# Patient Record
Sex: Male | Born: 1955 | Race: White | Hispanic: No | Marital: Single | Smoking: Never smoker
Health system: Southern US, Community
[De-identification: ages and names within clinical notes are randomized; demographics above are authoritative.]

## PROBLEM LIST (undated history)

## (undated) DIAGNOSIS — J449 Chronic obstructive pulmonary disease, unspecified: Secondary | ICD-10-CM

## (undated) DIAGNOSIS — J302 Other seasonal allergic rhinitis: Secondary | ICD-10-CM

## (undated) HISTORY — PX: EYE SURGERY: SHX253

## (undated) HISTORY — PX: TONSILLECTOMY: SUR1361

---

## 2008-10-26 ENCOUNTER — Ambulatory Visit: Payer: Self-pay | Admitting: Internal Medicine

## 2009-07-17 ENCOUNTER — Ambulatory Visit: Payer: Self-pay | Admitting: Family Medicine

## 2009-09-27 ENCOUNTER — Ambulatory Visit: Payer: Self-pay | Admitting: Internal Medicine

## 2009-10-28 ENCOUNTER — Ambulatory Visit: Payer: Self-pay | Admitting: Orthopedic Surgery

## 2010-09-15 ENCOUNTER — Emergency Department: Payer: Self-pay | Admitting: Emergency Medicine

## 2011-07-11 HISTORY — PX: RETINAL DETACHMENT SURGERY: SHX105

## 2011-10-07 ENCOUNTER — Ambulatory Visit: Payer: Self-pay | Admitting: Family Medicine

## 2013-03-23 ENCOUNTER — Ambulatory Visit: Payer: Self-pay | Admitting: Emergency Medicine

## 2014-10-02 ENCOUNTER — Ambulatory Visit: Payer: Self-pay | Admitting: Family Medicine

## 2015-01-30 ENCOUNTER — Ambulatory Visit
Admission: EM | Admit: 2015-01-30 | Discharge: 2015-01-30 | Disposition: A | Discharge status: Home / Self Care | Payer: BC Managed Care – PPO | Attending: Emergency Medicine | Admitting: Emergency Medicine

## 2015-01-30 DIAGNOSIS — Z23 Encounter for immunization: Secondary | ICD-10-CM | POA: Diagnosis not present

## 2015-01-30 DIAGNOSIS — T148 Other injury of unspecified body region: Secondary | ICD-10-CM | POA: Diagnosis not present

## 2015-01-30 DIAGNOSIS — T148XXA Other injury of unspecified body region, initial encounter: Secondary | ICD-10-CM

## 2015-01-30 HISTORY — DX: Chronic obstructive pulmonary disease, unspecified: J44.9

## 2015-01-30 MED ORDER — MUPIROCIN 2 % EX OINT: 1.0000 "application " | TOPICAL_OINTMENT | Freq: Three times a day (TID) | CUTANEOUS | Status: AC

## 2015-01-30 MED ORDER — TETANUS-DIPHTH-ACELL PERTUSSIS 5-2.5-18.5 LF-MCG/0.5 IM SUSP
0.5000 mL | Freq: Once | INTRAMUSCULAR | Status: AC
  Administered 2015-01-30: 0.5 mL via INTRAMUSCULAR

## 2015-01-30 NOTE — ED Provider Notes (Signed)
CSN: [id number]     Arrival date & time 01/30/15  1352 History   None    Chief Complaint  Patient presents with  . Laceration    Superficial laceration to right forearm and unsure when last tetanus. Would like tetanus to be updated today. No active bleeding. Reports he was mowing the grass and a wire cut his arm.    (Consider location/radiation/quality/duration/timing/severity/associated sxs/prior Treatment) HPI  59 year old male presents today with a very small superficial laceration to his right extensor forearm when a piece of wire cut his arm while he was mowing the grass. He states he cannot remember when he had his last tetanus shot and came in today mostly for that reason.  Past Medical History  Diagnosis Date  . COPD (chronic obstructive pulmonary disease)    Past Surgical History  Procedure Laterality Date  . Tonsillectomy    . Retinal detachment surgery     No family history on file. History  Substance Use Topics  . Smoking status: Unknown If Ever Smoked  . Smokeless tobacco: Not on file  . Alcohol Use: No    Review of Systems  Skin: Positive for wound.  All other systems reviewed and are negative.   Allergies  Review of patient's allergies indicates no known allergies.  Home Medications   Prior to Admission medications   Medication Sig Start Date End Date Taking? Authorizing Provider  fluticasone (FLONASE) 50 MCG/ACT nasal spray Place into both nostrils daily.   Yes Historical Provider, MD  Fluticasone-Salmeterol (ADVAIR) 250-50 MCG/DOSE AEPB Inhale 1 puff into the lungs 2 (two) times daily.   Yes Historical Provider, MD  mupirocin ointment (BACTROBAN) 2 % Apply 1 application topically 3 (three) times daily. 01/30/15   Chrissie Noa Brynnley Dayrit, PA-C   BP 132/80 mmHg  Pulse 79  Temp(Src) 98.1 F (36.7 C) (Oral)  Resp 18  Ht  (1.905 m)  Wt 205 lb (92.987 kg)  BMI 25.62 kg/m2  SpO2 98% Physical Exam  Constitutional: He is oriented to person, place, and time.  He appears well-developed and well-nourished.  HENT:  Head: Normocephalic and atraumatic.  Eyes: Pupils are equal, round, and reactive to light.  Neurological: He is alert and oriented to person, place, and time.  Skin:  Admission of the right forearm shows a small laceration through the epidermis with intact dermis beneath. Measures half centimeter in length. It has sharp edges without flapping.  Psychiatric: He has a normal mood and affect. His behavior is normal. Judgment and thought content normal.  Nursing note and vitals reviewed.   ED Course  Procedures (including critical care time) Labs Review Labs Reviewed - No data to display  Imaging Review No results found. Medications  Tdap (BOOSTRIX) injection 0.5 mL (0.5 mLs Intramuscular Given 01/30/15 1420)    MDM   1. Superficial laceration    New Prescriptions   MUPIROCIN OINTMENT (BACTROBAN) 2 %    Apply 1 application topically 3 (three) times daily.  Plan: 1. Diagnosis reviewed with patient 2. rx as per orders; risks, benefits, potential side effects reviewed with patient 3. Recommend supportive treatment with cleaning,Bactroban 4. F/u prn if symptoms worsen or don't improve     Lutricia Feil, PA-C 01/30/15 1452

## 2015-03-17 ENCOUNTER — Ambulatory Visit: Payer: BC Managed Care – PPO

## 2015-03-17 ENCOUNTER — Ambulatory Visit
Admission: EM | Admit: 2015-03-17 | Discharge: 2015-03-17 | Disposition: A | Discharge status: Home / Self Care | Payer: BC Managed Care – PPO | Attending: Family Medicine | Admitting: Family Medicine

## 2015-03-17 DIAGNOSIS — S60511A Abrasion of right hand, initial encounter: Secondary | ICD-10-CM | POA: Diagnosis not present

## 2015-03-17 NOTE — ED Notes (Signed)
Patient states that right hand was hit with softball bat at practice tonight. He states area was bleeding and he did put ice on it. Patient states that area is throbbing.

## 2015-03-20 NOTE — ED Provider Notes (Signed)
CSN: [id number]     Arrival date & time 03/17/15  1911 History   First MD Initiated Contact with Patient 03/17/15 2010     Chief Complaint  Patient presents with  . Hand Pain   (Consider location/radiation/quality/duration/timing/severity/associated sxs/prior Treatment) HPI  59 yo M  presents with painful right hand. 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 Dorsum slightly abraded, mild swelling and tender, ecchymosis.  Was seen last month for superficial wound received doing yard work and his tetanus is updated. No functional deficit. Dorsum tender to touch.  Past Medical History  Diagnosis Date  . COPD (chronic obstructive pulmonary disease)    Past Surgical History  Procedure Laterality Date  . Tonsillectomy    . Retinal detachment surgery Right 2013   Family History  Problem Relation Age of Onset  . Congestive Heart Failure Mother   . Lung cancer Father    Social History  Substance Use Topics  . Smoking status: Never Smoker   . Smokeless tobacco: None  . Alcohol Use: No    Review of Systems Review of 10 systems negative for acute change except as referenced in HPI  Allergies  Review of patient's allergies indicates no known allergies.  Home Medications   Prior to Admission medications   Medication Sig Start Date End Date Taking? Authorizing Provider  fluticasone (FLONASE) 50 MCG/ACT nasal spray Place into both nostrils daily.   Yes Historical Provider, MD  Fluticasone-Salmeterol (ADVAIR) 250-50 MCG/DOSE AEPB Inhale 1 puff into the lungs 2 (two) times daily.   Yes Historical Provider, MD  mupirocin ointment (BACTROBAN) 2 % Apply 1 application topically 3 (three) times daily. 01/30/15   Lutricia Feil, PA-C   Meds Ordered and Administered this Visit  Medications - No data to display  BP 151/75 mmHg  Pulse 77  Temp(Src) 97.2 F (36.2 C) (Oral)  Resp 16  Ht 6\' 3"  (1.905 m)  Wt 205 lb (92.987 kg)  BMI 25.62 kg/m2  SpO2 94% No data  found. Marland Kitchenlwl  Physical Exam   Constitutional: Alert and oriented, well appearing, VS are noted,  General : no acute distress; HEENT:  Head:normocephalic, atraumatic,                Eyes: conjugate gaze,negative conjunctiva      Mouth/throat :Mucous membranes moist, Neck :  supple without thyromegaly Lymph: without enlargement Lung:   effort and breath sounds normal , no distress Heart:   normal rate,regular rhythm,  Back:    No CVAT, no spinal tenderness noted MSK:   nontender, normal ROM all extremities;normal flexion; ambulatory, on and off table without assistance Neuro: CN ll-Xl as tested,grossly intact; normal gait, normal speech and language Skin:  Warm,dry, dorsum right hand to 2 very small abrasions, no active bleeding, dorsum with ecchymosis ,mild tenderness- full function fingers, hand ,wrist  Good cap fill, no neurosensory changes Psych: mood and affect WNL  ED Course  Procedures (including critical care time)  Wounds cleansed with surgical scrub , rinsed, dried and triple antibiotic with single bandaid placed to cover both areas Labs Review Labs Reviewed - No data to display  Imaging Review No results found.  :     Xrays: negative for acute fracture or dislocation     MDM   1. Abrasion, hand, right, initial encounter    Dressing and wound care reviewed with patient Has Bactroban from previous visit Report back with inflammation or signs of infection, reviewed with patient  Diagnosis  and treatment discussed. . Questions fielded, expectations and recommendations reviewed.  Patient expresses understanding. Will return to [facility] with questions, concern or exacerbation.     Rae Halsted, PA-C 03/20/15 [phone]

## 2015-04-25 ENCOUNTER — Ambulatory Visit
Admission: EM | Admit: 2015-04-25 | Discharge: 2015-04-25 | Disposition: A | Discharge status: Home / Self Care | Payer: BC Managed Care – PPO | Attending: Family Medicine | Admitting: Family Medicine

## 2015-04-25 ENCOUNTER — Encounter: Payer: Self-pay | Admitting: Gynecology

## 2015-04-25 DIAGNOSIS — J440 Chronic obstructive pulmonary disease with acute lower respiratory infection: Secondary | ICD-10-CM

## 2015-04-25 DIAGNOSIS — J209 Acute bronchitis, unspecified: Secondary | ICD-10-CM

## 2015-04-25 LAB — RAPID STREP SCREEN (MED CTR MEBANE ONLY): Streptococcus, Group A Screen (Direct): NEGATIVE

## 2015-04-25 MED ORDER — SULFAMETHOXAZOLE-TRIMETHOPRIM 800-160 MG PO TABS: 1.0000 | ORAL_TABLET | Freq: Two times a day (BID) | ORAL | Status: AC

## 2015-04-25 MED ORDER — HYDROCOD POLST-CPM POLST ER 10-8 MG/5ML PO SUER: 5.0000 mL | Freq: Two times a day (BID) | ORAL | Status: DC

## 2015-04-25 NOTE — ED Notes (Signed)
Patient c/o cough x 2 days and a sore throat.

## 2015-04-25 NOTE — ED Provider Notes (Signed)
CSN: [id number]     Arrival date & time 04/25/15  1527 History   First MD Initiated Contact with Patient 04/25/15 1600     Chief Complaint  Patient presents with  . Cough   (Consider location/radiation/quality/duration/timing/severity/associated sxs/prior Treatment) HPI   This a 59 year old with a history of COPD resents with coughing for the last 2 days. The cough is productive with greenish sputum. He also has a low-grade temperature of 99.3 and O2 sat of 100%. He states that whenever he lies down or after he eats the coughing is much worse. Is unable to sleep because of the cough. He never smoked but was around secondhand smoke with his  father a heavy smoker. 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  Past Medical History  Diagnosis Date  . COPD (chronic obstructive pulmonary disease) [facility](HCC)    Past Surgical History  Procedure Laterality Date  . Tonsillectomy    . Retinal detachment surgery Right 2013   Family History  Problem Relation Age of Onset  . Congestive Heart Failure Mother   . Lung cancer Father    Social History  Substance Use Topics  . Smoking status: Never Smoker   . Smokeless tobacco: None  . Alcohol Use: No    Review of Systems  Constitutional: Positive for fever and chills. Negative for diaphoresis and fatigue.  HENT: Positive for congestion and sore throat.   Respiratory: Positive for choking. Negative for shortness of breath, wheezing and stridor.   All other systems reviewed and are negative.   Allergies  Review of patient's allergies indicates no known allergies.  Home Medications   Prior to Admission medications   Medication Sig Start Date End Date Taking? Authorizing Provider  fluticasone (FLONASE) 50 MCG/ACT nasal spray Place into both nostrils daily.   Yes Historical Provider, MD  Fluticasone-Salmeterol (ADVAIR) 250-50 MCG/DOSE AEPB Inhale 1 puff into the  lungs 2 (two) times daily.   Yes Historical Provider, MD  mupirocin ointment (BACTROBAN) 2 % Apply 1 application topically 3 (three) times daily. 01/30/15  Yes Lutricia FeilWilliam P Roemer, PA-C  chlorpheniramine-HYDROcodone (TUSSIONEX PENNKINETIC ER) 10-8 MG/5ML SUER Take 5 mLs by mouth 2 (two) times daily. 04/25/15   Lutricia FeilWilliam P Roemer, PA-C  sulfamethoxazole-trimethoprim (BACTRIM DS,SEPTRA DS) 800-160 MG tablet Take 1 tablet by mouth 2 (two) times daily. 04/25/15   Lutricia FeilWilliam P Roemer, PA-C   Meds Ordered and Administered this Visit  Medications - No data to display  BP 128/86 mmHg  Pulse 74  Temp(Src) 99.3 F (37.4 C) (Oral)  Resp 16  Ht 6\' 3"  (1.905 m)  Wt 203 lb (92.08 kg)  BMI 25.37 kg/m2  SpO2 100% No data found.   Physical Exam  Constitutional: He is oriented to person, place, and time. He appears well-developed and well-nourished. No distress.  HENT:  Head: Normocephalic and atraumatic.  Right Ear: External ear normal.  Left Ear: External ear normal.  Nose: Nose normal.  Mouth/Throat: Oropharynx is clear and moist.  Eyes: Pupils are equal, round, and reactive to light.  Neck: Neck supple.  Pulmonary/Chest: Effort normal and breath sounds normal. No respiratory distress. He has no wheezes. He has no rales.  Musculoskeletal: Normal range of motion. He exhibits no edema or tenderness.  Lymphadenopathy:    He has no cervical adenopathy.  Neurological: He is alert and oriented to person, place, and time.  Skin: Skin is warm and dry. He is not  diaphoretic.  Psychiatric: He has a normal mood and affect. His behavior is normal. Judgment and thought content normal.  Nursing note and vitals reviewed.   ED Course  Procedures (including critical care time)  Labs Review Labs Reviewed  RAPID STREP SCREEN (NOT AT [facility])  CULTURE, GROUP A STREP ([facility] ONLY)    Imaging Review No results found.   Visual Acuity Review  Right Eye Distance:   Left Eye Distance:   Bilateral Distance:     Right Eye Near:   Left Eye Near:    Bilateral Near:         MDM   1. COPD (chronic obstructive pulmonary disease) with acute bronchitis (HCC)    Discharge Medication List as of 04/25/2015  4:20 PM    START taking these medications   Details  chlorpheniramine-HYDROcodone (TUSSIONEX PENNKINETIC ER) 10-8 MG/5ML SUER Take 5 mLs by mouth 2 (two) times daily., Starting 04/25/2015, Until Discontinued, Print    sulfamethoxazole-trimethoprim (BACTRIM DS,SEPTRA DS) 800-160 MG tablet Take 1 tablet by mouth 2 (two) times daily., Starting 04/25/2015, Until Discontinued, Print      Plan: 1. Test/x-ray results and diagnosis reviewed with patient 2. rx as per orders; risks, benefits, potential side effects reviewed with patient 3. Recommend supportive treatment with rest,fluids,albuterol PRN 4. F/u prn if symptoms worsen or don't improve     Lutricia Feil, PA-C 04/25/15 1659

## 2015-04-27 LAB — CULTURE, GROUP A STREP (THRC)

## 2015-04-27 NOTE — ED Notes (Signed)
Final report of strep screening test negative

## 2016-11-08 ENCOUNTER — Ambulatory Visit
Admission: EM | Admit: 2016-11-08 | Discharge: 2016-11-08 | Disposition: A | Discharge status: Home / Self Care | Payer: BC Managed Care – PPO | Attending: Family Medicine | Admitting: Family Medicine

## 2016-11-08 ENCOUNTER — Encounter: Payer: Self-pay | Admitting: *Deleted

## 2016-11-08 DIAGNOSIS — J069 Acute upper respiratory infection, unspecified: Secondary | ICD-10-CM

## 2016-11-08 DIAGNOSIS — B9789 Other viral agents as the cause of diseases classified elsewhere: Secondary | ICD-10-CM

## 2016-11-08 MED ORDER — HYDROCOD POLST-CPM POLST ER 10-8 MG/5ML PO SUER: 5.0000 mL | Freq: Two times a day (BID) | ORAL | 0 refills | Status: AC | PRN

## 2016-11-08 NOTE — ED Provider Notes (Signed)
[facility]    CSN: [id number] Arrival date & time: 11/08/16  1533     History   Chief Complaint Chief Complaint  Patient presents with  . Fever  . Cough  . Nasal Congestion    HPI George Robertson. is a 61 y.o. male.   The history is provided by the patient.  URI  Presenting symptoms: congestion, cough, fever and rhinorrhea   Severity:  Moderate Onset quality:  Sudden Duration:  3 days Timing:  Constant Progression:  Worsening Chronicity:  New Relieved by:  Nothing Ineffective treatments:  OTC medications Associated symptoms: no arthralgias, no headaches, no myalgias, no neck pain, no sinus pain, no sneezing and no wheezing   Risk factors: sick contacts (school)   Risk factors: not elderly, no immunosuppression and no recent travel     Past Medical History:  Diagnosis Date  . COPD (chronic obstructive pulmonary disease) (HCC)     There are no active problems to display for this patient.   Past Surgical History:  Procedure Laterality Date  . RETINAL DETACHMENT SURGERY Right 2013  . TONSILLECTOMY         Home Medications    Prior to Admission medications   Medication Sig Start Date End Date Taking? Authorizing Provider  fluticasone (FLONASE) 50 MCG/ACT nasal spray Place into both nostrils daily.   Yes Historical Provider, MD  Fluticasone-Salmeterol (ADVAIR) 250-50 MCG/DOSE AEPB Inhale 1 puff into the lungs 2 (two) times daily.   Yes Historical Provider, MD  chlorpheniramine-HYDROcodone (TUSSIONEX PENNKINETIC ER) 10-8 MG/5ML SUER Take 5 mLs by mouth every 12 (twelve) hours as needed. 11/08/16   Payton Mccallum, MD  mupirocin ointment (BACTROBAN) 2 % Apply 1 application topically 3 (three) times daily. 01/30/15   Lutricia Feil, PA-C  sulfamethoxazole-trimethoprim (BACTRIM DS,SEPTRA DS) 800-160 MG tablet Take 1 tablet by mouth 2 (two) times daily. 04/25/15   Lutricia Feil, PA-C    Family History Family History  Problem Relation Age of  Onset  . Congestive Heart Failure Mother   . Lung cancer Father     Social History Social History  Substance Use Topics  . Smoking status: Never Smoker  . Smokeless tobacco: Never Used  . Alcohol use No     Allergies   Patient has no known allergies.   Review of Systems Review of Systems  Constitutional: Positive for fever.  HENT: Positive for congestion and rhinorrhea. Negative for sinus pain and sneezing.   Respiratory: Positive for cough. Negative for wheezing.   Musculoskeletal: Negative for arthralgias, myalgias and neck pain.  Neurological: Negative for headaches.     Physical Exam Triage Vital Signs ED Triage Vitals  Enc Vitals Group     BP 11/08/16 1632 134/89     Pulse Rate 11/08/16 1632 70     Resp 11/08/16 1632 16     Temp 11/08/16 1632 98.9 F (37.2 C)     Temp Source 11/08/16 1632 Oral     SpO2 11/08/16 1632 98 %     Weight 11/08/16 1633 211 lb (95.7 kg)     Height 11/08/16 1633  (1.905 m)     Head Circumference --      Peak Flow --      Pain Score 11/08/16 1634 0     Pain Loc --      Pain Edu? --      Excl. in GC? --    No data found.   Updated Vital Signs BP  134/89 (BP Location: Left Arm)   Pulse 70   Temp 98.9 F (37.2 C) (Oral)   Resp 16   Ht  (1.905 m)   Wt 211 lb (95.7 kg)   SpO2 98%   BMI 26.37 kg/m   Visual Acuity Right Eye Distance:   Left Eye Distance:   Bilateral Distance:    Right Eye Near:   Left Eye Near:    Bilateral Near:     Physical Exam  Constitutional: He appears well-developed and well-nourished. No distress.  HENT:  Head: Normocephalic and atraumatic.  Right Ear: Tympanic membrane, external ear and ear canal normal.  Left Ear: Tympanic membrane, external ear and ear canal normal.  Nose: Nose normal.  Mouth/Throat: Uvula is midline, oropharynx is clear and moist and mucous membranes are normal. No oropharyngeal exudate or tonsillar abscesses.  Eyes: Conjunctivae and EOM are normal. Pupils are  equal, round, and reactive to light. Right eye exhibits no discharge. Left eye exhibits no discharge. No scleral icterus.  Neck: Normal range of motion. Neck supple. No tracheal deviation present. No thyromegaly present.  Cardiovascular: Normal rate, regular rhythm and normal heart sounds.   Pulmonary/Chest: Effort normal and breath sounds normal. No stridor. No respiratory distress. He has no wheezes. He has no rales. He exhibits no tenderness.  Lymphadenopathy:    He has no cervical adenopathy.  Neurological: He is alert.  Skin: Skin is warm and dry. No rash noted. He is not diaphoretic.  Nursing note and vitals reviewed.    UC Treatments / Results  Labs (all labs ordered are listed, but only abnormal results are displayed) Labs Reviewed - No data to display  EKG  EKG Interpretation None       Radiology No results found.  Procedures Procedures (including critical care time)  Medications Ordered in UC Medications - No data to display   Initial Impression / Assessment and Plan / UC Course  I have reviewed the triage vital signs and the nursing notes.  Pertinent labs & imaging results that were available during my care of the patient were reviewed by me and considered in my medical decision making (see chart for details).       Final Clinical Impressions(s) / UC Diagnoses   Final diagnoses:  Viral URI with cough    New Prescriptions Discharge Medication List as of 11/08/2016  5:34 PM     1. diagnosis reviewed with patient 2. rx as per orders above; reviewed possible side effects, interactions, risks and benefits  3. Recommend supportive treatment with rest, fuids  4. Follow-up prn if symptoms worsen or don't improve   Payton Mccallum, MD 11/08/16 1810

## 2016-11-08 NOTE — ED Triage Notes (Signed)
Patient started having symptoms of cough, nasal congestion, fever 3 days ago.

## 2016-11-08 NOTE — Discharge Instructions (Signed)
Tylenol/advil as needed Sudafed decongest as needed

## 2017-05-30 IMAGING — CR DG HAND COMPLETE 3+V*R*
3 series · 3 of 3 positions shown · non-contrast
Comparison: None.

CLINICAL DATA: Trauma to the dorsal aspect of the right hand with a
softball

EXAM:
RIGHT HAND - COMPLETE 3+ VIEW

[hand ap]
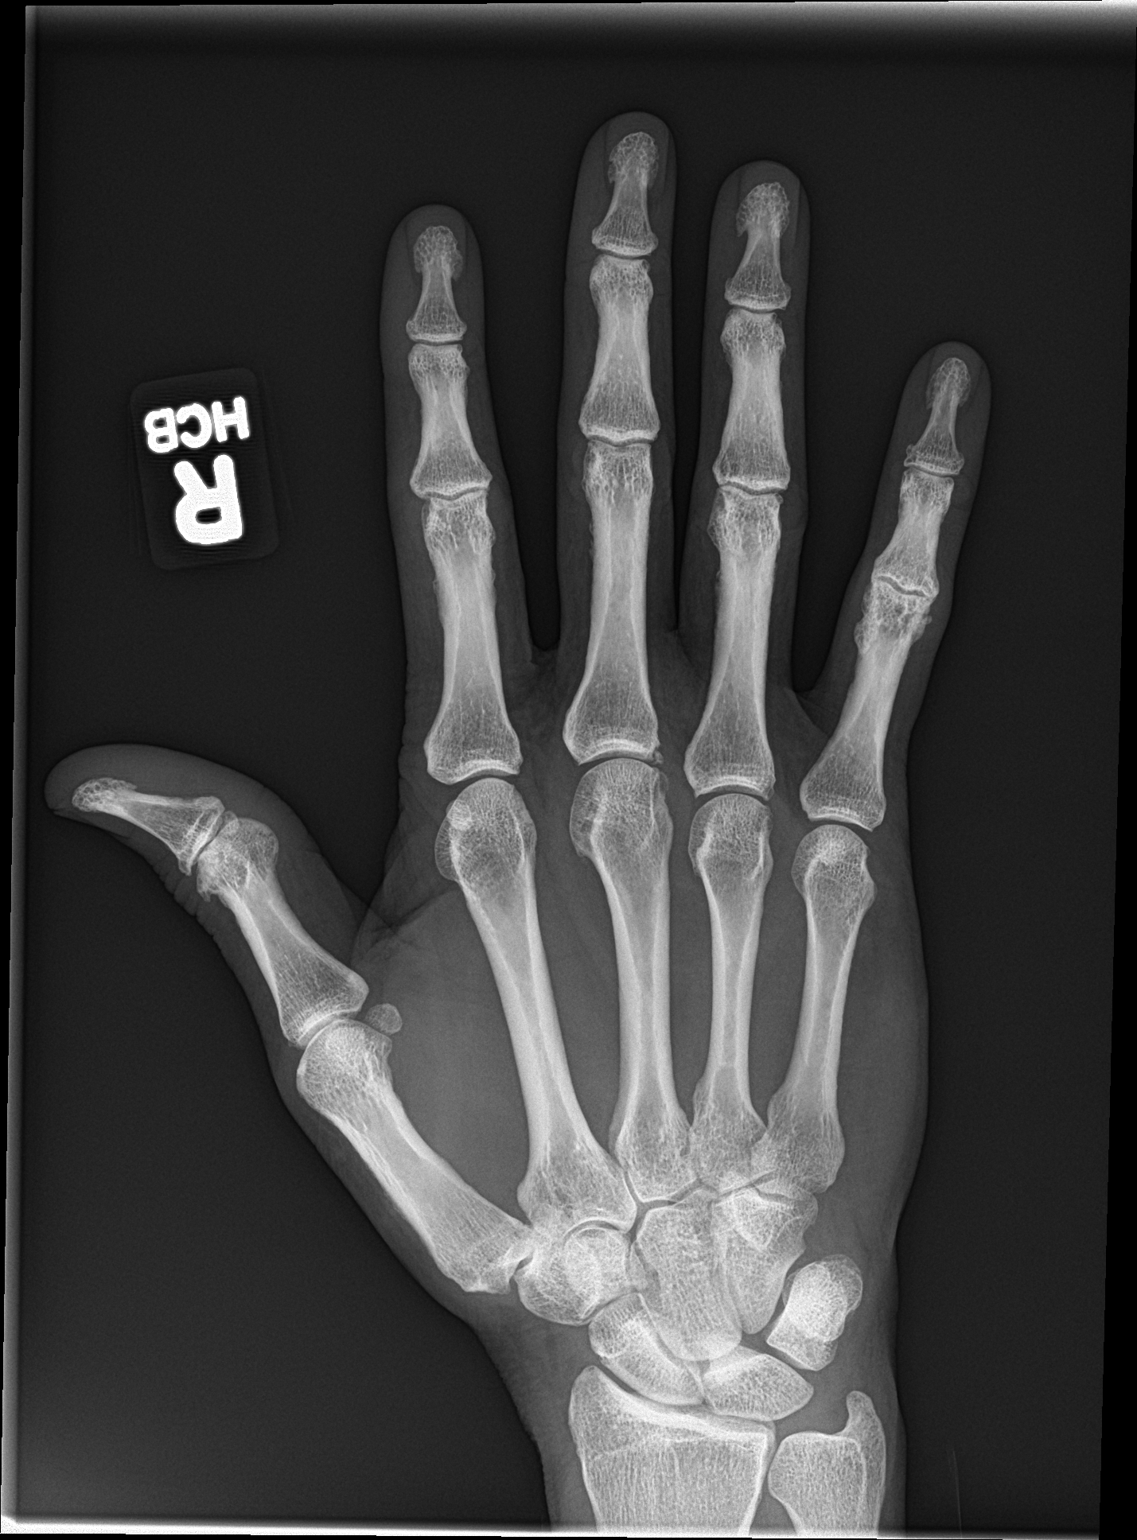

[hand obl]
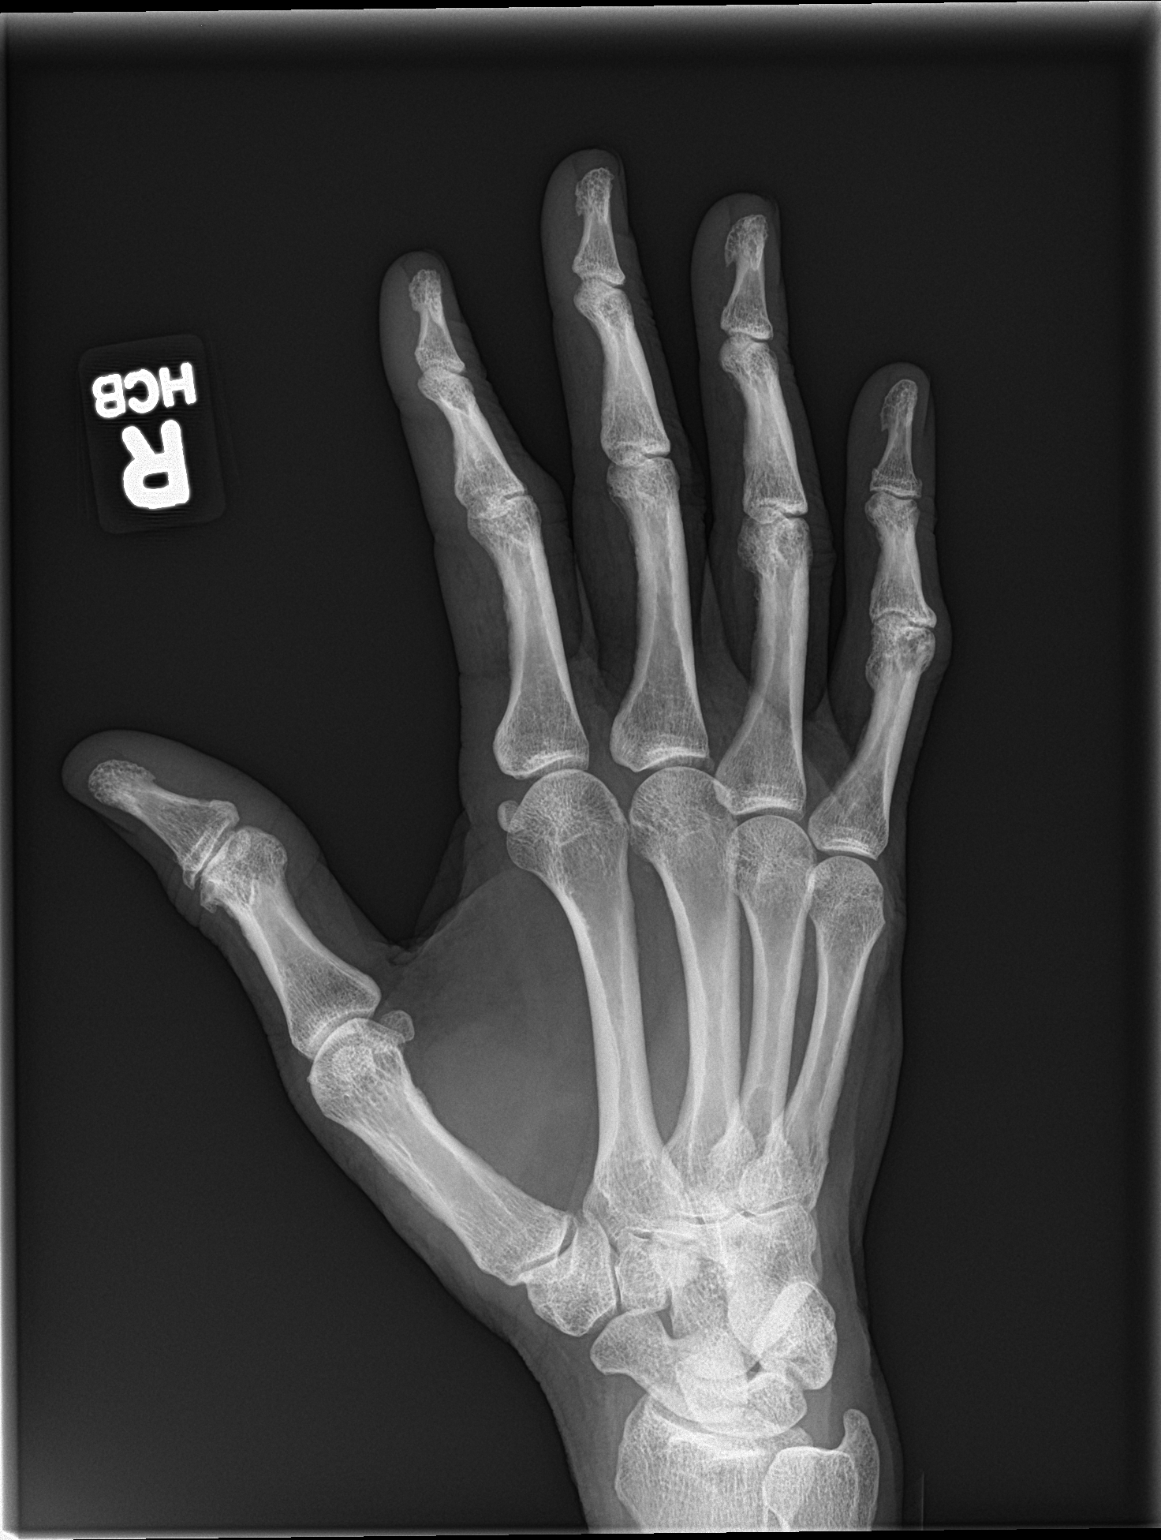

[hand lat]
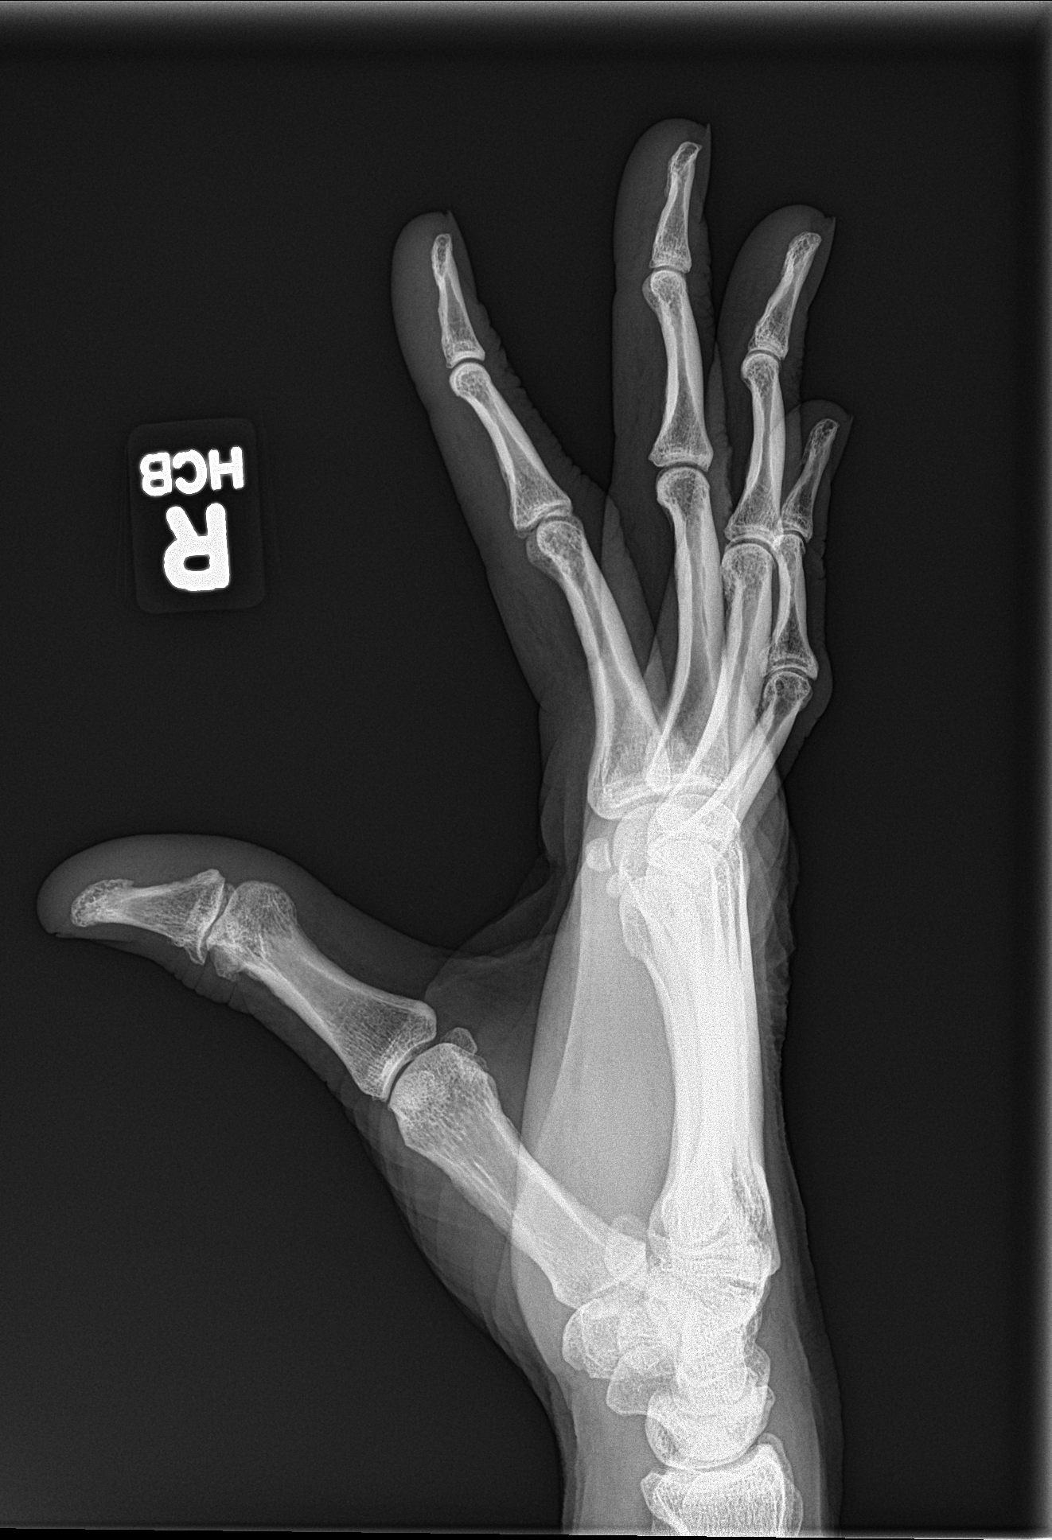

[3 of 3 positions shown; findings below may reference images not displayed]

FINDINGS: There is no evidence of fracture or dislocation. There is no
evidence of arthropathy or other focal bone abnormality. Soft
tissues are unremarkable. Well corticated osseous fragment likely
representing remote avulsion fracture at the base of the proximal
phalanx of the right third digit.
IMPRESSION: Negative.

## 2019-02-05 ENCOUNTER — Encounter: Payer: Self-pay | Admitting: *Deleted

## 2021-06-24 ENCOUNTER — Encounter: Payer: Self-pay | Admitting: Gastroenterology

## 2021-06-26 ENCOUNTER — Other Ambulatory Visit: Payer: Self-pay

## 2021-06-26 ENCOUNTER — Ambulatory Visit
Admission: EM | Admit: 2021-06-26 | Discharge: 2021-06-26 | Disposition: A | Discharge status: Home / Self Care | Payer: BC Managed Care – PPO | Attending: Emergency Medicine | Admitting: Emergency Medicine

## 2021-06-26 DIAGNOSIS — R051 Acute cough: Secondary | ICD-10-CM | POA: Diagnosis not present

## 2021-06-26 DIAGNOSIS — Z20822 Contact with and (suspected) exposure to covid-19: Secondary | ICD-10-CM | POA: Diagnosis not present

## 2021-06-26 DIAGNOSIS — J449 Chronic obstructive pulmonary disease, unspecified: Secondary | ICD-10-CM | POA: Insufficient documentation

## 2021-06-26 DIAGNOSIS — R519 Headache, unspecified: Secondary | ICD-10-CM | POA: Diagnosis not present

## 2021-06-26 DIAGNOSIS — R0981 Nasal congestion: Secondary | ICD-10-CM | POA: Diagnosis present

## 2021-06-26 LAB — RESP PANEL BY RT-PCR (FLU A&B, COVID) ARPGX2
Influenza A by PCR: NEGATIVE
Influenza B by PCR: NEGATIVE
SARS Coronavirus 2 by RT PCR: NEGATIVE

## 2021-06-26 NOTE — ED Triage Notes (Signed)
Patient is here today for "Cough, Congestion". Symptoms started with "cough" yesterday. With laying down "worse" & "hoarse voice". No sob. No wheezing. Colonoscopy schd for tomorrow "want to make sure I am ok for it". No fever. Flu vaccine done. COVID19 vaccine done.

## 2021-06-26 NOTE — ED Provider Notes (Signed)
[facility]    CSN: [id number] Arrival date & time: 06/26/21  0841      History   Chief Complaint Chief Complaint  Patient presents with   Cough   Nasal Congestion    HPI George Robertson. is a 65 y.o. male.   Patient presents today with a 24-hour history of URI symptoms including nasal congestion and dry cough.  He denies any fever, chest pain, shortness of breath, nausea, vomiting, body aches.  He does report a mild headache.  Denies any known sick contacts.  He has not been taking any over-the-counter medication for symptom management.  He does have a history of COPD and has been taking Advair as prescribed.  He also has a history of allergies but has not been using Flonase recently.  Reports episode slightly worse than typical allergies.  He has had COVID-19 and influenza vaccine.  He is preparing for colonoscopy scheduled for tomorrow and wants to ensure that he is not sick.  He denies any recent antibiotic use.  He does not smoke.   Past Medical History:  Diagnosis Date   COPD (chronic obstructive pulmonary disease) (HCC)    Seasonal allergies     There are no problems to display for this patient.   Past Surgical History:  Procedure Laterality Date   EYE SURGERY     RETINAL DETACHMENT SURGERY Right 07/11/2011   TONSILLECTOMY         Home Medications    Prior to Admission medications   Medication Sig Start Date End Date Taking? Authorizing Provider  diphenhydrAMINE (BENADRYL) 25 mg capsule Take 25 mg by mouth every 6 (six) hours as needed.   Yes [provider]  fluticasone (FLONASE) 50 MCG/ACT nasal spray Place into both nostrils daily.   Yes [provider]  Fluticasone-Salmeterol (ADVAIR) 250-50 MCG/DOSE AEPB Inhale 1 puff into the lungs 2 (two) times daily.   Yes [provider]  chlorpheniramine-HYDROcodone (TUSSIONEX PENNKINETIC ER) 10-8 MG/5ML SUER Take 5 mLs by mouth every 12 (twelve) hours as needed. 11/08/16    Payton Mccallum, MD  mupirocin ointment (BACTROBAN) 2 % Apply 1 application topically 3 (three) times daily. 01/30/15   Lutricia Feil, PA-C  sulfamethoxazole-trimethoprim (BACTRIM DS,SEPTRA DS) 800-160 MG tablet Take 1 tablet by mouth 2 (two) times daily. 04/25/15   Lutricia Feil, PA-C    Family History Family History  Problem Relation Age of Onset   Congestive Heart Failure Mother    Lung cancer Father     Social History Social History   Tobacco Use   Smoking status: Never   Smokeless tobacco: Never  Vaping Use   Vaping Use: Never used  Substance Use Topics   Alcohol use: No    Alcohol/week: 0.0 standard drinks   Drug use: No     Allergies   Patient has no known allergies.   Review of Systems Review of Systems  Constitutional:  Positive for fatigue. Negative for activity change, appetite change and fever.  HENT:  Positive for congestion and postnasal drip. Negative for sinus pressure, sneezing and sore throat.   Respiratory:  Positive for cough. Negative for shortness of breath.   Cardiovascular:  Negative for chest pain.  Gastrointestinal:  Negative for abdominal pain, diarrhea, nausea and vomiting.  Musculoskeletal:  Negative for arthralgias and myalgias.  Neurological:  Positive for headaches. Negative for dizziness and light-headedness.    Physical Exam Triage Vital Signs ED Triage Vitals  Enc Vitals Group  BP 06/26/21 0922 (!) 122/91     Pulse Rate 06/26/21 0922 (!) 102     Resp 06/26/21 0922 18     Temp 06/26/21 0922 98.6 F (37 C)     Temp Source 06/26/21 0922 Oral     SpO2 06/26/21 0922 100 %     Weight 06/26/21 0920 205 lb (93 kg)     Height --      Head Circumference --      Peak Flow --      Pain Score 06/26/21 0920 0     Pain Loc --      Pain Edu? --      Excl. in GC? --    No data found.  Updated Vital Signs BP (!) 122/91 (BP Location: Left Arm)    Pulse 100    Temp 98.6 F (37 C) (Oral)    Resp 18    Wt 205 lb (93 kg)    SpO2  100%    BMI 25.62 kg/m   Visual Acuity Right Eye Distance:   Left Eye Distance:   Bilateral Distance:    Right Eye Near:   Left Eye Near:    Bilateral Near:     Physical Exam Vitals reviewed.  Constitutional:      General: He is awake.     Appearance: Normal appearance. He is well-developed. He is not ill-appearing.     Comments: Very pleasant male appears stated age in no acute distress sitting comfortably in exam room  HENT:     Head: Normocephalic and atraumatic.     Right Ear: Tympanic membrane, ear canal and external ear normal. Tympanic membrane is not erythematous or bulging.     Left Ear: Tympanic membrane, ear canal and external ear normal. Tympanic membrane is not erythematous or bulging.     Nose: Nose normal.     Mouth/Throat:     Pharynx: Uvula midline. Posterior oropharyngeal erythema present. No oropharyngeal exudate.     Comments: Erythema and drainage in posterior oropharynx Cardiovascular:     Rate and Rhythm: Regular rhythm. Tachycardia present.     Heart sounds: Normal heart sounds, S1 normal and S2 normal. No murmur heard. Pulmonary:     Effort: Pulmonary effort is normal. No accessory muscle usage or respiratory distress.     Breath sounds: Normal breath sounds. No stridor. No wheezing, rhonchi or rales.     Comments: Clear to auscultation bilaterally Abdominal:     General: Bowel sounds are normal.     Palpations: Abdomen is soft.     Tenderness: There is no abdominal tenderness.  Neurological:     Mental Status: He is alert.  Psychiatric:        Behavior: Behavior is cooperative.     UC Treatments / Results  Labs (all labs ordered are listed, but only abnormal results are displayed) Labs Reviewed  RESP PANEL BY RT-PCR (FLU A&B, COVID) [id number]    EKG   Radiology No results found.  Procedures Procedures (including critical care time)  Medications Ordered in UC Medications - No data to display  Initial Impression / Assessment and Plan  / UC Course  I have reviewed the triage vital signs and the nursing notes.  Pertinent labs & imaging results that were available during my care of the patient were reviewed by me and considered in my medical decision making (see chart for details).     Patient is afebrile and nontoxic-appearing in clinic today.  Flu and  COVID testing were negative.  Discussed with patient that symptoms could be related to his allergies but also could be the beginning of a viral illness that was not detected due to low viral count since he is only been symptomatic for a day.  Recommended he use over-the-counter medication for symptom management.  He is scheduled for colonoscopy tomorrow.  Recommended that he call his gastroenterologist and discuss symptoms to determine if they have any recommendations regarding postponing procedure.  Discussed that if he has any worsening symptoms or symptoms not improving he should certainly call them and cancel procedure tomorrow.  Discussed alarm symptoms that warrant emergent evaluation including fever, chest pain, shortness of breath, nausea/vomiting interfering with oral intake, fatigue, weakness.  Strict return precautions given to which she expressed understanding.  Final Clinical Impressions(s) / UC Diagnoses   Final diagnoses:  Acute cough  Nasal congestion     Discharge Instructions      You tested negative for COVID and flu.  It is possible this is just related to your allergies.  It is also possible this is the beginning of an illness.  I think it is reasonable to call your gastroenterologist and tell them about your symptoms to see if they have any recommendations regarding postponing your colonoscopy.  Certainly if you are feeling worse I recommend that you postpone the colonoscopy.  Use over-the-counter medications including Tylenol for symptom relief.  If you have any worsening symptoms including chest pain, shortness of breath, fever, worsening cough,  nausea/vomiting interfering with oral intake you need to go to the emergency room.     ED Prescriptions   None    PDMP not reviewed this encounter.   Jeani Hawking, PA-C 06/26/21 1104

## 2021-06-26 NOTE — Discharge Instructions (Signed)
You tested negative for COVID and flu.  It is possible this is just related to your allergies.  It is also possible this is the beginning of an illness.  I think it is reasonable to call your gastroenterologist and tell them about your symptoms to see if they have any recommendations regarding postponing your colonoscopy.  Certainly if you are feeling worse I recommend that you postpone the colonoscopy.  Use over-the-counter medications including Tylenol for symptom relief.  If you have any worsening symptoms including chest pain, shortness of breath, fever, worsening cough, nausea/vomiting interfering with oral intake you need to go to the emergency room.

## 2021-06-27 ENCOUNTER — Encounter: Admission: RE | Payer: Self-pay | Source: Ambulatory Visit

## 2021-06-27 ENCOUNTER — Ambulatory Visit
Admission: RE | Admit: 2021-06-27 | Payer: BC Managed Care – PPO | Source: Ambulatory Visit | Admitting: Gastroenterology

## 2021-06-27 HISTORY — DX: Other seasonal allergic rhinitis: J30.2

## 2021-06-27 SURGERY — COLONOSCOPY WITH PROPOFOL
Anesthesia: General

## 2021-10-01 ENCOUNTER — Ambulatory Visit
Admission: EM | Admit: 2021-10-01 | Discharge: 2021-10-01 | Disposition: A | Discharge status: Home / Self Care | Payer: BC Managed Care – PPO | Attending: Emergency Medicine | Admitting: Emergency Medicine

## 2021-10-01 ENCOUNTER — Other Ambulatory Visit: Payer: Self-pay

## 2021-10-01 DIAGNOSIS — J321 Chronic frontal sinusitis: Secondary | ICD-10-CM

## 2021-10-01 MED ORDER — AMOXICILLIN-POT CLAVULANATE 875-125 MG PO TABS: 1.0000 | ORAL_TABLET | Freq: Two times a day (BID) | ORAL | 0 refills | Status: AC

## 2021-10-01 NOTE — Discharge Instructions (Addendum)
Take Augmentin twice daily for ten days. ?You can take 10 mg of Zyrtec once daily.  ?

## 2021-10-01 NOTE — ED Triage Notes (Signed)
Pt present facial pain with cough and nasal congestion. Symptom started on Monday. Pt state that he has tried otc medication with no relief.  Pt state his is having some pain are around nose and eyes.  ?

## 2021-10-01 NOTE — ED Provider Notes (Signed)
? ?[facility] ?Provider Note ? ?Patient Contact: 9:03 AM (approximate) ? ? ?History  ? ?Cough and Nasal Congestion ? ? ?HPI ? ?George Robertson. is a 66 y.o. male presents to the urgent care with maxillary and frontal sinus tenderness and nasal congestion that started on Monday.  Patient states that he frequently experiences sinus infections but has not been treated for [facility] with antibiotic this year.  He has a history of mild COPD but no other medical issues.  He denies chest pain or abdominal pain.  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 ? ?  ? ? ?Physical Exam  ? ?Triage Vital Signs: ?ED Triage Vitals [10/01/21 0852]  ?Enc Vitals Group  ?   BP (!) 133/94  ?   Pulse Rate 80  ?   Resp 18  ?   Temp 97.8 ?F (36.6 ?C)  ?   Temp Source Oral  ?   SpO2 99 %  ?   Weight   ?   Height   ?   Head Circumference   ?   Peak Flow   ?   Pain Score 5  ?   Pain Loc   ?   Pain Edu?   ?   Excl. in GC?   ? ? ?Most recent vital signs: ?Vitals:  ? 10/01/21 0852  ?BP: (!) 133/94  ?Pulse: 80  ?Resp: 18  ?Temp: 97.8 ?F (36.6 ?C)  ?SpO2: 99%  ? ? ? ?Constitutional: Alert and oriented. Patient is lying supine. ?Eyes: Conjunctivae are normal. PERRL. EOMI. ?Head: Atraumatic. ?ENT: ?     Ears: Tympanic membranes are mildly injected with mild effusion bilaterally.  ?     Nose: No congestion/rhinnorhea. ?     Mouth/Throat: Mucous membranes are moist. Posterior pharynx is mildly erythematous.  ?Hematological/Lymphatic/Immunilogical: No cervical lymphadenopathy.  ?Cardiovascular: Normal rate, regular rhythm. Normal S1 and S2.  Good peripheral circulation. ?Respiratory: Normal respiratory effort without tachypnea or retractions. Lungs CTAB. Good air entry to the bases with no decreased or absent breath sounds. ?Gastrointestinal: Bowel sounds ?4 quadrants. Soft and nontender to palpation. No guarding or rigidity. No palpable masses. No distention. No CVA  tenderness. ?Musculoskeletal: Full range of motion to all extremities. No gross deformities appreciated. ?Neurologic:  Normal speech and language. No gross focal neurologic deficits are appreciated.  ?Skin:  Skin is warm, dry and intact. No rash noted. ?Psychiatric: Mood and affect are normal. Speech and behavior are normal. Patient exhibits appropriate insight and judgement. ? ? ? ?ED Results / Procedures / Treatments  ? ?Labs ?(all labs ordered are listed, but only abnormal results are displayed) ?Labs Reviewed - No data to display ? ? ? ? ? ? ?PROCEDURES: ? ?Critical Care performed: No ? ?Procedures ? ? ?MEDICATIONS ORDERED IN ED: ?Medications - No data to display ? ? ?IMPRESSION / MDM / ASSESSMENT AND PLAN / ED COURSE  ?I reviewed the triage vital signs and the nursing notes. ?             ?               ? ?Assessment and Plan: ?Sinusitis:  ? ?Differential diagnosis includes, but is not limited to, sinusitis, bronchitis, viral URI.Marland Kitchen ? ?66 year old male presents to the urgent care with nasal congestion for 6 days and facial pain. ? ?We will treat with Augmentin for sinusitis.  Also recommended Zyrtec.  Return precautions were given to return with  new or worsening symptoms. ? ?  ? ? ?FINAL CLINICAL IMPRESSION(S) / ED DIAGNOSES  ? ?Final diagnoses:  ?Frontal sinusitis, unspecified chronicity  ? ? ? ?Rx / DC Orders  ? ?ED Discharge Orders   ? ?      Ordered  ?  amoxicillin-clavulanate (AUGMENTIN) 875-125 MG tablet  2 times daily       ? 10/01/21 0900  ? ?  ?  ? ?  ? ? ? ?Note:  This document was prepared using Dragon voice recognition software and may include unintentional dictation errors. ?  ?Orvil Feil, PA-C ?10/01/21 1607 ? ?

## 2023-02-12 ENCOUNTER — Ambulatory Visit: Payer: BC Managed Care – PPO

## 2023-02-12 DIAGNOSIS — Z1211 Encounter for screening for malignant neoplasm of colon: Secondary | ICD-10-CM

## 2023-02-12 DIAGNOSIS — D120 Benign neoplasm of cecum: Secondary | ICD-10-CM

## 2023-02-12 DIAGNOSIS — K640 First degree hemorrhoids: Secondary | ICD-10-CM
